# Patient Record
Sex: Male | Born: 1985 | Race: Black or African American | Hispanic: No | Marital: Single | Smoking: Current every day smoker
Health system: Southern US, Community
[De-identification: ages and names within clinical notes are randomized; demographics above are authoritative.]

## PROBLEM LIST (undated history)

## (undated) DIAGNOSIS — I10 Essential (primary) hypertension: Secondary | ICD-10-CM

---

## 2019-02-01 ENCOUNTER — Emergency Department (HOSPITAL_COMMUNITY): Payer: No Typology Code available for payment source

## 2019-02-01 ENCOUNTER — Encounter (HOSPITAL_COMMUNITY): Payer: Self-pay | Admitting: *Deleted

## 2019-02-01 ENCOUNTER — Other Ambulatory Visit: Payer: Self-pay

## 2019-02-01 ENCOUNTER — Emergency Department (HOSPITAL_COMMUNITY)
Admission: EM | Admit: 2019-02-01 | Discharge: 2019-02-01 | Disposition: A | Discharge status: Home / Self Care | Payer: No Typology Code available for payment source | Attending: Emergency Medicine | Admitting: Emergency Medicine

## 2019-02-01 DIAGNOSIS — Y9389 Activity, other specified: Secondary | ICD-10-CM | POA: Insufficient documentation

## 2019-02-01 DIAGNOSIS — M5440 Lumbago with sciatica, unspecified side: Secondary | ICD-10-CM | POA: Insufficient documentation

## 2019-02-01 DIAGNOSIS — M545 Low back pain: Secondary | ICD-10-CM | POA: Diagnosis present

## 2019-02-01 DIAGNOSIS — Y92410 Unspecified street and highway as the place of occurrence of the external cause: Secondary | ICD-10-CM | POA: Diagnosis not present

## 2019-02-01 DIAGNOSIS — Y999 Unspecified external cause status: Secondary | ICD-10-CM | POA: Diagnosis not present

## 2019-02-01 DIAGNOSIS — I10 Essential (primary) hypertension: Secondary | ICD-10-CM | POA: Diagnosis not present

## 2019-02-01 HISTORY — DX: Essential (primary) hypertension: I10

## 2019-02-01 MED ORDER — NAPROXEN 500 MG PO TABS: 500.0000 mg | ORAL_TABLET | Freq: Two times a day (BID) | ORAL | 0 refills | Status: AC

## 2019-02-01 MED ORDER — METHOCARBAMOL 500 MG PO TABS: 500.0000 mg | ORAL_TABLET | Freq: Two times a day (BID) | ORAL | 0 refills | Status: DC
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
## 2019-02-01 NOTE — Discharge Instructions (Addendum)
Your x-rays today were   I Have prescribed muscle relaxers for your pain, please do not drink or drive while taking this medications as they can make you drowsy. Please follow-up with PCP in 1 week for reevaluation of your symptoms.    If you experience any bowel or bladder incontinence, fever, worsening in your symptoms please return to the ED.

## 2019-02-06 ENCOUNTER — Other Ambulatory Visit: Payer: Self-pay

## 2019-02-07 ENCOUNTER — Encounter: Payer: Self-pay | Admitting: Family Medicine

## 2019-02-07 ENCOUNTER — Ambulatory Visit: Payer: Self-pay | Admitting: Family Medicine

## 2019-02-07 VITALS — BP 142/80 | HR 73 | Ht 72.0 in | Wt 223.0 lb

## 2019-02-07 DIAGNOSIS — M545 Low back pain, unspecified: Secondary | ICD-10-CM | POA: Insufficient documentation

## 2019-02-07 DIAGNOSIS — I10 Essential (primary) hypertension: Secondary | ICD-10-CM

## 2019-02-07 MED ORDER — HYDROCHLOROTHIAZIDE 25 MG PO TABS: 25.0000 mg | ORAL_TABLET | Freq: Every day | ORAL | 1 refills | Status: AC

## 2019-02-07 MED ORDER — TIZANIDINE HCL 4 MG PO TABS: 4.0000 mg | ORAL_TABLET | Freq: Four times a day (QID) | ORAL | 0 refills | Status: AC | PRN

## 2019-02-07 MED ORDER — KETOROLAC TROMETHAMINE 60 MG/2ML IM SOLN
60.0000 mg | Freq: Once | INTRAMUSCULAR | Status: AC
  Administered 2019-02-07: 60 mg via INTRAMUSCULAR
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
## 2019-02-07 NOTE — Patient Instructions (Addendum)
I reviewed your xray and it was normal.  This is good news. Your were given an injection of toradol today to help with pain Try tizanidine instead of robaxin.  Continue naproxen.    Low Back Sprain or Strain Rehab Ask your health care provider which exercises are safe for you. Do exercises exactly as told by your health care provider and adjust them as directed. It is normal to feel mild stretching, pulling, tightness, or discomfort as you do these exercises. Stop right away if you feel sudden pain or your pain gets worse. Do not begin these exercises until told by your health care provider. Stretching and range-of-motion exercises These exercises warm up your muscles and joints and improve the movement and flexibility of your back. These exercises also help to relieve pain, numbness, and tingling. Lumbar rotation  1. Lie on your back on a firm surface and bend your knees. 2. Straighten your arms out to your sides so each arm forms a 90-degree angle (right angle) with a side of your body. 3. Slowly move (rotate) both of your knees to one side of your body until you feel a stretch in your lower back (lumbar). Try not to let your shoulders lift off the floor. 4. Hold this position for __________ seconds. 5. Tense your abdominal muscles and slowly move your knees back to the starting position. 6. Repeat this exercise on the other side of your body. Repeat __________ times. Complete this exercise __________ times a day. Single knee to chest  1. Lie on your back on a firm surface with both legs straight. 2. Bend one of your knees. Use your hands to move your knee up toward your chest until you feel a gentle stretch in your lower back and buttock. ? Hold your leg in this position by holding on to the front of your knee. ? Keep your other leg as straight as possible. 3. Hold this position for __________ seconds. 4. Slowly return to the starting position. 5. Repeat with your other leg. Repeat  __________ times. Complete this exercise __________ times a day. Prone extension on elbows  1. Lie on your abdomen on a firm surface (prone position). 2. Prop yourself up on your elbows. 3. Use your arms to help lift your chest up until you feel a gentle stretch in your abdomen and your lower back. ? This will place some of your body weight on your elbows. If this is uncomfortable, try stacking pillows under your chest. ? Your hips should stay down, against the surface that you are lying on. Keep your hip and back muscles relaxed. 4. Hold this position for __________ seconds. 5. Slowly relax your upper body and return to the starting position. Repeat __________ times. Complete this exercise __________ times a day. Strengthening exercises These exercises build strength and endurance in your back. Endurance is the ability to use your muscles for a long time, even after they get tired. Pelvic tilt This exercise strengthens the muscles that lie deep in the abdomen. 1. Lie on your back on a firm surface. Bend your knees and keep your feet flat on the floor. 2. Tense your abdominal muscles. Tip your pelvis up toward the ceiling and flatten your lower back into the floor. ? To help with this exercise, you may place a small towel under your lower back and try to push your back into the towel. 3. Hold this position for __________ seconds. 4. Let your muscles relax completely before you repeat this  exercise. Repeat __________ times. Complete this exercise __________ times a day. Alternating arm and leg raises  1. Get on your hands and knees on a firm surface. If you are on a hard floor, you may want to use padding, such as an exercise mat, to cushion your knees. 2. Line up your arms and legs. Your hands should be directly below your shoulders, and your knees should be directly below your hips. 3. Lift your left leg behind you. At the same time, raise your right arm and straighten it in front of  you. ? Do not lift your leg higher than your hip. ? Do not lift your arm higher than your shoulder. ? Keep your abdominal and back muscles tight. ? Keep your hips facing the ground. ? Do not arch your back. ? Keep your balance carefully, and do not hold your breath. 4. Hold this position for __________ seconds. 5. Slowly return to the starting position. 6. Repeat with your right leg and your left arm. Repeat __________ times. Complete this exercise __________ times a day. Abdominal set with straight leg raise  1. Lie on your back on a firm surface. 2. Bend one of your knees and keep your other leg straight. 3. Tense your abdominal muscles and lift your straight leg up, 4-6 inches (10-15 cm) off the ground. 4. Keep your abdominal muscles tight and hold this position for __________ seconds. ? Do not hold your breath. ? Do not arch your back. Keep it flat against the ground. 5. Keep your abdominal muscles tense as you slowly lower your leg back to the starting position. 6. Repeat with your other leg. Repeat __________ times. Complete this exercise __________ times a day. Single leg lower with bent knees 1. Lie on your back on a firm surface. 2. Tense your abdominal muscles and lift your feet off the floor, one foot at a time, so your knees and hips are bent in 90-degree angles (right angles). ? Your knees should be over your hips and your lower legs should be parallel to the floor. 3. Keeping your abdominal muscles tense and your knee bent, slowly lower one of your legs so your toe touches the ground. 4. Lift your leg back up to return to the starting position. ? Do not hold your breath. ? Do not let your back arch. Keep your back flat against the ground. 5. Repeat with your other leg. Repeat __________ times. Complete this exercise __________ times a day. Posture and body mechanics Good posture and healthy body mechanics can help to relieve stress in your body's tissues and joints.  Body mechanics refers to the movements and positions of your body while you do your daily activities. Posture is part of body mechanics. Good posture means:  Your spine is in its natural S-curve position (neutral).  Your shoulders are pulled back slightly.  Your head is not tipped forward. Follow these guidelines to improve your posture and body mechanics in your everyday activities. Standing   When standing, keep your spine neutral and your feet about hip width apart. Keep a slight bend in your knees. Your ears, shoulders, and hips should line up.  When you do a task in which you stand in one place for a long time, place one foot up on a stable object that is 2-4 inches (5-10 cm) high, such as a footstool. This helps keep your spine neutral. Sitting   When sitting, keep your spine neutral and keep your feet flat on the  floor. Use a footrest, if necessary, and keep your thighs parallel to the floor. Avoid rounding your shoulders, and avoid tilting your head forward.  When working at a desk or a computer, keep your desk at a height where your hands are slightly lower than your elbows. Slide your chair under your desk so you are close enough to maintain good posture.  When working at a computer, place your monitor at a height where you are looking straight ahead and you do not have to tilt your head forward or downward to look at the screen. Resting  When lying down and resting, avoid positions that are most painful for you.  If you have pain with activities such as sitting, bending, stooping, or squatting, lie in a position in which your body does not bend very much. For example, avoid curling up on your side with your arms and knees near your chest (fetal position).  If you have pain with activities such as standing for a long time or reaching with your arms, lie with your spine in a neutral position and bend your knees slightly. Try the following positions: ? Lying on your side with a  pillow between your knees. ? Lying on your back with a pillow under your knees. Lifting   When lifting objects, keep your feet at least shoulder width apart and tighten your abdominal muscles.  Bend your knees and hips and keep your spine neutral. It is important to lift using the strength of your legs, not your back. Do not lock your knees straight out.  Always ask for help to lift heavy or awkward objects. This information is not intended to replace advice given to you by your health care provider. Make sure you discuss any questions you have with your health care provider. Document Released: 06/28/2005 Document Revised: 10/20/2018 Document Reviewed: 07/20/2018 Elsevier Patient Education  2020 [employer]

## 2019-02-07 NOTE — Assessment & Plan Note (Signed)
-  BP is mildly elevated today.  -We'll get him restarted back on HCTZ and check BMP today.  -He'll plan to f/u in 6 months.

## 2019-02-08 LAB — BASIC METABOLIC PANEL
BUN: 19 mg/dL (ref 7–25)
CO2: 22 mmol/L (ref 20–32)
Calcium: 9.1 mg/dL (ref 8.6–10.3)
Chloride: 107 mmol/L (ref 98–110)
Creat: 0.99 mg/dL (ref 0.60–1.35)
Glucose, Bld: 99 mg/dL (ref 65–99)
Potassium: 3.8 mmol/L (ref 3.5–5.3)
Sodium: 140 mmol/L (ref 135–146)

## 2019-03-12 ENCOUNTER — Ambulatory Visit: Payer: Self-pay | Admitting: Family Medicine
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
